# Patient Record
Sex: Female | Born: 1950 | Race: White | Hispanic: No | Marital: Married | Smoking: Never smoker
Health system: Southern US, Community
[De-identification: ages and names within clinical notes are randomized; demographics above are authoritative.]

## PROBLEM LIST (undated history)

## (undated) HISTORY — PX: TONSILLECTOMY: SUR1361

## (undated) HISTORY — PX: GANGLION CYST EXCISION: SHX1691

---

## 1999-10-27 ENCOUNTER — Encounter: Payer: Self-pay | Admitting: Obstetrics and Gynecology

## 1999-10-27 ENCOUNTER — Encounter: Admission: RE | Admit: 1999-10-27 | Discharge: 1999-10-27 | Payer: Self-pay | Admitting: Obstetrics and Gynecology

## 2000-11-11 ENCOUNTER — Encounter: Payer: Self-pay | Admitting: Obstetrics and Gynecology

## 2000-11-11 ENCOUNTER — Encounter: Admission: RE | Admit: 2000-11-11 | Discharge: 2000-11-11 | Payer: Self-pay | Admitting: Obstetrics and Gynecology

## 2000-12-15 ENCOUNTER — Other Ambulatory Visit: Admission: RE | Admit: 2000-12-15 | Discharge: 2000-12-15 | Payer: Self-pay | Admitting: Obstetrics and Gynecology

## 2000-12-20 ENCOUNTER — Encounter: Payer: Self-pay | Admitting: Obstetrics and Gynecology

## 2000-12-20 ENCOUNTER — Ambulatory Visit (HOSPITAL_COMMUNITY): Admission: RE | Admit: 2000-12-20 | Discharge: 2000-12-20 | Payer: Self-pay | Admitting: Obstetrics and Gynecology

## 2001-01-31 ENCOUNTER — Encounter: Payer: Self-pay | Admitting: Obstetrics and Gynecology

## 2001-01-31 ENCOUNTER — Ambulatory Visit (HOSPITAL_COMMUNITY): Admission: RE | Admit: 2001-01-31 | Discharge: 2001-01-31 | Payer: Self-pay | Admitting: Obstetrics and Gynecology

## 2001-11-14 ENCOUNTER — Encounter: Payer: Self-pay | Admitting: Obstetrics and Gynecology

## 2001-11-14 ENCOUNTER — Encounter: Admission: RE | Admit: 2001-11-14 | Discharge: 2001-11-14 | Payer: Self-pay | Admitting: Obstetrics and Gynecology

## 2002-01-10 ENCOUNTER — Other Ambulatory Visit: Admission: RE | Admit: 2002-01-10 | Discharge: 2002-01-10 | Payer: Self-pay | Admitting: Obstetrics and Gynecology

## 2002-11-16 ENCOUNTER — Encounter: Admission: RE | Admit: 2002-11-16 | Discharge: 2002-11-16 | Payer: Self-pay | Admitting: Obstetrics and Gynecology

## 2003-01-16 ENCOUNTER — Other Ambulatory Visit: Admission: RE | Admit: 2003-01-16 | Discharge: 2003-01-16 | Payer: Self-pay | Admitting: Obstetrics and Gynecology

## 2003-04-04 ENCOUNTER — Encounter: Admission: RE | Admit: 2003-04-04 | Discharge: 2003-04-04 | Payer: Self-pay | Admitting: Obstetrics and Gynecology

## 2003-11-22 ENCOUNTER — Encounter: Admission: RE | Admit: 2003-11-22 | Discharge: 2003-11-22 | Payer: Self-pay | Admitting: Obstetrics and Gynecology

## 2004-02-01 ENCOUNTER — Other Ambulatory Visit: Admission: RE | Admit: 2004-02-01 | Discharge: 2004-02-01 | Payer: Self-pay | Admitting: Obstetrics and Gynecology

## 2004-12-03 ENCOUNTER — Encounter: Admission: RE | Admit: 2004-12-03 | Discharge: 2004-12-03 | Payer: Self-pay | Admitting: Obstetrics and Gynecology

## 2005-02-10 ENCOUNTER — Other Ambulatory Visit: Admission: RE | Admit: 2005-02-10 | Discharge: 2005-02-10 | Payer: Self-pay | Admitting: Obstetrics and Gynecology

## 2005-12-04 ENCOUNTER — Encounter: Admission: RE | Admit: 2005-12-04 | Discharge: 2005-12-04 | Payer: Self-pay | Admitting: Obstetrics and Gynecology

## 2009-01-14 ENCOUNTER — Encounter: Admission: RE | Admit: 2009-01-14 | Discharge: 2009-01-14 | Payer: Self-pay | Admitting: Obstetrics and Gynecology

## 2009-05-13 ENCOUNTER — Encounter: Admission: RE | Admit: 2009-05-13 | Discharge: 2009-05-13 | Payer: Self-pay | Admitting: Obstetrics and Gynecology

## 2010-01-23 ENCOUNTER — Encounter
Admission: RE | Admit: 2010-01-23 | Discharge: 2010-01-23 | Payer: Self-pay | Source: Home / Self Care | Attending: Obstetrics and Gynecology | Admitting: Obstetrics and Gynecology

## 2010-02-09 ENCOUNTER — Encounter: Payer: Self-pay | Admitting: Obstetrics and Gynecology

## 2010-12-19 ENCOUNTER — Other Ambulatory Visit: Payer: Self-pay | Admitting: Obstetrics and Gynecology

## 2010-12-19 DIAGNOSIS — Z1231 Encounter for screening mammogram for malignant neoplasm of breast: Secondary | ICD-10-CM

## 2011-01-26 ENCOUNTER — Ambulatory Visit
Admission: RE | Admit: 2011-01-26 | Discharge: 2011-01-26 | Disposition: A | Discharge status: Home / Self Care | Payer: BC Managed Care – PPO | Source: Ambulatory Visit | Attending: Obstetrics and Gynecology | Admitting: Obstetrics and Gynecology

## 2011-01-26 DIAGNOSIS — Z1231 Encounter for screening mammogram for malignant neoplasm of breast: Secondary | ICD-10-CM

## 2011-09-24 ENCOUNTER — Emergency Department (HOSPITAL_BASED_OUTPATIENT_CLINIC_OR_DEPARTMENT_OTHER)
Admission: EM | Admit: 2011-09-24 | Discharge: 2011-09-25 | Disposition: A | Discharge status: Home / Self Care | Payer: BC Managed Care – PPO | Attending: Emergency Medicine | Admitting: Emergency Medicine

## 2011-09-24 ENCOUNTER — Emergency Department (HOSPITAL_BASED_OUTPATIENT_CLINIC_OR_DEPARTMENT_OTHER): Payer: BC Managed Care – PPO

## 2011-09-24 ENCOUNTER — Encounter (HOSPITAL_BASED_OUTPATIENT_CLINIC_OR_DEPARTMENT_OTHER): Payer: Self-pay | Admitting: Emergency Medicine

## 2011-09-24 DIAGNOSIS — Z79899 Other long term (current) drug therapy: Secondary | ICD-10-CM | POA: Insufficient documentation

## 2011-09-24 DIAGNOSIS — R197 Diarrhea, unspecified: Secondary | ICD-10-CM | POA: Insufficient documentation

## 2011-09-24 DIAGNOSIS — R110 Nausea: Secondary | ICD-10-CM | POA: Insufficient documentation

## 2011-09-24 DIAGNOSIS — R42 Dizziness and giddiness: Secondary | ICD-10-CM | POA: Insufficient documentation

## 2011-09-24 DIAGNOSIS — H8110 Benign paroxysmal vertigo, unspecified ear: Secondary | ICD-10-CM

## 2011-09-24 DIAGNOSIS — H538 Other visual disturbances: Secondary | ICD-10-CM | POA: Insufficient documentation

## 2011-09-24 LAB — CBC
HCT: 35.7 % — ABNORMAL LOW (ref 36.0–46.0)
Hemoglobin: 12.1 g/dL (ref 12.0–15.0)
MCH: 29.2 pg (ref 26.0–34.0)
MCHC: 33.9 g/dL (ref 30.0–36.0)
MCV: 86 fL (ref 78.0–100.0)
Platelets: 279 10*3/uL (ref 150–400)
RBC: 4.15 MIL/uL (ref 3.87–5.11)
RDW: 13.2 % (ref 11.5–15.5)
WBC: 5.3 10*3/uL (ref 4.0–10.5)

## 2011-09-24 LAB — COMPREHENSIVE METABOLIC PANEL
ALT: 14 U/L (ref 0–35)
AST: 22 U/L (ref 0–37)
Albumin: 3.8 g/dL (ref 3.5–5.2)
Alkaline Phosphatase: 64 U/L (ref 39–117)
BUN: 17 mg/dL (ref 6–23)
Calcium: 9.5 mg/dL (ref 8.4–10.5)
Chloride: 101 mEq/L (ref 96–112)
Creatinine, Ser: 0.7 mg/dL (ref 0.50–1.10)
GFR calc Af Amer: >90 mL/min (ref >90)
Glucose, Bld: 114 mg/dL — ABNORMAL HIGH (ref 70–99)
Sodium: 138 mEq/L (ref 135–145)
Total Bilirubin: 0.2 mg/dL — ABNORMAL LOW (ref 0.3–1.2)
Total Protein: 6.9 g/dL (ref 6.0–8.3)

## 2011-09-24 LAB — RAPID URINE DRUG SCREEN, HOSP PERFORMED
Barbiturates: NOT DETECTED
Benzodiazepines: NOT DETECTED
Cocaine: NOT DETECTED
Opiates: NOT DETECTED
Tetrahydrocannabinol: NOT DETECTED

## 2011-09-24 LAB — DIFFERENTIAL
Basophils Absolute: 0 10*3/uL (ref 0.0–0.1)
Basophils Relative: 0 % (ref 0–1)
Eosinophils Absolute: 0.1 10*3/uL (ref 0.0–0.7)
Eosinophils Relative: 1 % (ref 0–5)
Lymphs Abs: 1.6 10*3/uL (ref 0.7–4.0)
Monocytes Absolute: 0.5 10*3/uL (ref 0.1–1.0)
Monocytes Relative: 9 % (ref 3–12)
Neutro Abs: 3.2 10*3/uL (ref 1.7–7.7)
Neutrophils Relative %: 60 % (ref 43–77)

## 2011-09-24 LAB — GLUCOSE, CAPILLARY: Glucose-Capillary: 107 mg/dL — ABNORMAL HIGH (ref 70–99)

## 2011-09-24 LAB — TROPONIN I: Troponin I: <0.3 ng/mL (ref <0.30)

## 2011-09-24 LAB — URINALYSIS, ROUTINE W REFLEX MICROSCOPIC
Ketones, ur: NEGATIVE mg/dL
Nitrite: NEGATIVE
Protein, ur: NEGATIVE mg/dL

## 2011-09-24 LAB — CK TOTAL AND CKMB (NOT AT ARMC)
CK, MB: 1.9 ng/mL (ref 0.3–4.0)
Relative Index: INVALID (ref 0.0–2.5)
Total CK: 92 U/L (ref 7–177)

## 2011-09-24 LAB — APTT: aPTT: 29 seconds (ref 24–37)

## 2011-09-24 MED ORDER — MECLIZINE HCL 25 MG PO TABS
25.0000 mg | ORAL_TABLET | Freq: Once | ORAL | Status: AC
  Administered 2011-09-24: 25 mg via ORAL
  Filled 2011-09-24: qty 1

## 2011-09-24 MED ORDER — ONDANSETRON HCL 4 MG/2ML IJ SOLN
4.0000 mg | Freq: Once | INTRAMUSCULAR | Status: AC
  Administered 2011-09-24: 4 mg via INTRAVENOUS
  Filled 2011-09-24: qty 2

## 2011-09-24 MED ORDER — SODIUM CHLORIDE 0.9 % IV BOLUS (SEPSIS)
500.0000 mL | Freq: Once | INTRAVENOUS | Status: AC
  Administered 2011-09-24: 500 mL via INTRAVENOUS

## 2011-09-24 NOTE — ED Notes (Signed)
Onset today of dizziness and nausea

## 2011-09-24 NOTE — ED Provider Notes (Signed)
History     CSN: [id number]  Arrival date & time 09/24/11  2151   First MD Initiated Contact with Patient 09/24/11 2210      Chief Complaint  Patient presents with  . Dizziness    (Consider location/radiation/quality/duration/timing/severity/associated sxs/prior treatment) HPI Patient is a 61 year old female who presents today complaining of acute onset of dizziness which she describes as a spinning sensation at 1 PM today. Patient has had this 2 other times and has never sought evaluation. She is reported that these episodes have lasted several hours and resolved spontaneously in the past. She does feel that they are related to position. She endorses nausea and denies any vomiting. Patient notes that she did have some diarrhea as well. She denies any abdominal pain. Patient denies numbness, tingling, or weakness. She denies any visual deficits. Patient reports that she does have some slightly blurry vision out of her left eye. Patient has no history of TIA or stroke.  Patient denies any changes in her hearing. She reports that she has a brother with a history of spontaneous intracranial bleed as well as family history of stroke. Patient denies any history of diabetes or hypertension. There are no other associated or modifying factors. History reviewed. No pertinent past medical history.  Past Surgical History  Procedure Date  . Ganglion cyst excision     History reviewed. No pertinent family history.  History  Substance Use Topics  . Smoking status: Never Smoker   . Smokeless tobacco: Not on file  . Alcohol Use: No    OB History    Grav Para Term Preterm Abortions TAB SAB Ect Mult Living                  Review of Systems  Constitutional: Negative.   Eyes: Positive for visual disturbance.  Respiratory: Negative.   Cardiovascular: Negative.   Gastrointestinal: Positive for nausea and diarrhea. Negative for vomiting and blood in stool.  Genitourinary: Negative.     Musculoskeletal: Negative.   Skin: Negative.   Neurological: Positive for dizziness and headaches. Negative for weakness and numbness.  Hematological: Negative.   Psychiatric/Behavioral: Negative.   All other systems reviewed and are negative.    Allergies  Sulfa drugs cross reactors  Home Medications   Current Outpatient Rx  Name Route Sig Dispense Refill  . ALENDRONATE SODIUM 70 MG PO TABS Oral Take 70 mg by mouth every 7 (seven) days. Take with a full glass of water on an empty stomach.    Marland Kitchen CALCIUM CARBONATE 600 MG PO TABS Oral Take 600 mg by mouth 2 (two) times daily with a meal.    . VITAMIN D 1000 UNITS PO TABS Oral Take 1,000 Units by mouth daily.    . IBUPROFEN 200 MG PO TABS Oral Take 200 mg by mouth every 6 (six) hours as needed. For headache.    Marland Kitchen LYSINE PO Oral Take 1 tablet by mouth daily as needed. For cold sore.    Marland Kitchen ONE-DAILY MULTI VITAMINS PO TABS Oral Take 1 tablet by mouth daily.    . RED YEAST RICE PO Oral Take 2 capsules by mouth daily.      BP 147/81  Pulse 102  Resp 16  Ht 5\' 2"  (1.575 m)  Wt 130 lb (58.968 kg)  BMI 23.78 kg/m2  Physical Exam  Nursing note and vitals reviewed. GEN: Well-developed, well-nourished female in no distress HEENT: Atraumatic, normocephalic. Oropharynx clear without erythema EYES: PERRLA BL, no scleral icterus. NECK: Trachea  midline, no meningismus CV: regular rate and rhythm. No murmurs, rubs, or gallops PULM: No respiratory distress.  No crackles, wheezes, or rales. GI: soft, non-tender. No guarding, rebound, or tenderness. + bowel sounds  GU: deferred Neuro: cranial nerves grossly 2-12 intact, no abnormalities of strength or sensation, A and O x 3, no dysmetria on finger to nose task bilaterally, no limb drift, no facial droop, no visual field deficits, no pronator drift, no dysarthria, patient does have exacerbation of her symptoms on Dix-Hallpike maneuver MSK: Patient moves all 4 extremities symmetrically, no  deformity, edema, or injury noted Skin: No rashes petechiae, purpura, or jaundice Psych: no abnormality of mood   ED Course  Procedures (including critical care time)  Indication: Dizziness Please note this EKG was reviewed extemporaneously by myself.   Date: 09/24/2011  Rate: 70  Rhythm: normal sinus rhythm  QRS Axis: normal  Intervals: normal  ST/T Wave abnormalities: nonspecific T wave changes  Conduction Disutrbances:none  Narrative Interpretation:   Old EKG Reviewed: none available      Labs Reviewed  CBC - Abnormal; Notable for the following:    HCT 35.7 (*)     All other components within normal limits  COMPREHENSIVE METABOLIC PANEL - Abnormal; Notable for the following:    Glucose, Bld 114 (*)     Total Bilirubin 0.2 (*)     All other components within normal limits  URINALYSIS, ROUTINE W REFLEX MICROSCOPIC - Abnormal; Notable for the following:    Hgb urine dipstick SMALL (*)     Leukocytes, UA SMALL (*)     All other components within normal limits  GLUCOSE, CAPILLARY - Abnormal; Notable for the following:    Glucose-Capillary 107 (*)     All other components within normal limits  URINE MICROSCOPIC-ADD ON - Abnormal; Notable for the following:    Bacteria, UA FEW (*)     All other components within normal limits  PROTIME-INR  APTT  DIFFERENTIAL  CK TOTAL AND CKMB  TROPONIN I  URINE RAPID DRUG SCREEN (HOSP PERFORMED)   Ct Head Wo Contrast  09/24/2011  *RADIOLOGY REPORT*  Clinical Data: Dizziness for 10 hours.  No known injury.  CT HEAD WITHOUT CONTRAST  Technique:  Contiguous axial images were obtained from the base of the skull through the vertex without contrast.  Comparison: None.  Findings: There is no evidence of acute intracranial hemorrhage, mass lesion, brain edema or extra-axial fluid collection.  The ventricles and subarachnoid spaces are appropriately sized for age. There is no CT evidence of acute cortical infarction.  There is a polypoid filling  defect in the left division of the sphenoid sinus which measures 1.6 cm in diameter and 54 HU.  This could reflect a polyp or atypical mucous retention cyst.  The visualized paranasal sinuses are otherwise clear. The calvarium is intact. The mastoids and middle ears are clear.  The internal auditory canals appear symmetric.  IMPRESSION:  1.  No acute intracranial or calvarial findings. 2.  Left sphenoid sinus polyp or mucous retention cyst.   Original Report Authenticated By: Gerrianne Scale, M.D.      1. BPPV (benign paroxysmal positional vertigo)       MDM  Patient was evaluated by myself. Based on presentation patient was evaluated. She was outside the three-hour window to merit code stroke evaluation. Additionally patient had isolated complained of slight blurry vision in the left eye as well as dizziness. Stroke workup was performed and was unremarkable. Patient was feeling  better following return of results. She was given a 500 cc normal saline IV bolus as well as a dose of meclizine. Patient did receive one dose of Zofran initially. As she was feeling better patient initially had symptoms that were able to be recreated with Dix-Hallpike maneuver. I feel that her symptoms are most likely secondary to be BPPV today. She had no signs of infection such as UTI or other laboratory abnormalities. Patient was discharged with prescription for Zofran and meclizine. She was told that there are strokes that can present with similar symptoms and that she should not assume that all episodes of dizziness are from this benign cause. She is advised to followup with her primary care provider and was discharged in good condition.        Cyndra Numbers, MD 09/25/11 [phone]

## 2011-09-25 MED ORDER — MECLIZINE HCL 25 MG PO TABS: 25.0000 mg | ORAL_TABLET | Freq: Three times a day (TID) | ORAL | Status: AC | PRN

## 2011-09-25 MED ORDER — ONDANSETRON 8 MG PO TBDP: 8.0000 mg | ORAL_TABLET | Freq: Three times a day (TID) | ORAL | Status: AC | PRN

## 2011-12-22 ENCOUNTER — Other Ambulatory Visit: Payer: Self-pay | Admitting: Obstetrics and Gynecology

## 2011-12-22 DIAGNOSIS — Z1231 Encounter for screening mammogram for malignant neoplasm of breast: Secondary | ICD-10-CM

## 2012-02-05 ENCOUNTER — Ambulatory Visit
Admission: RE | Admit: 2012-02-05 | Discharge: 2012-02-05 | Disposition: A | Discharge status: Home / Self Care | Payer: BC Managed Care – PPO | Source: Ambulatory Visit | Attending: Obstetrics and Gynecology | Admitting: Obstetrics and Gynecology

## 2012-02-05 DIAGNOSIS — Z1231 Encounter for screening mammogram for malignant neoplasm of breast: Secondary | ICD-10-CM

## 2013-01-02 ENCOUNTER — Other Ambulatory Visit: Payer: Self-pay

## 2013-01-02 DIAGNOSIS — Z1231 Encounter for screening mammogram for malignant neoplasm of breast: Secondary | ICD-10-CM

## 2013-02-06 ENCOUNTER — Ambulatory Visit
Admission: RE | Admit: 2013-02-06 | Discharge: 2013-02-06 | Disposition: A | Discharge status: Home / Self Care | Payer: Self-pay | Source: Ambulatory Visit

## 2013-02-06 DIAGNOSIS — Z1231 Encounter for screening mammogram for malignant neoplasm of breast: Secondary | ICD-10-CM

## 2014-10-16 IMAGING — MG MM SCREENING BREAST TOMO BILATERAL
8 series · 9 of 24 positions shown · non-contrast
Comparison: Previous exam(s).

CLINICAL DATA: Screening.

EXAM:
DIGITAL SCREENING BILATERAL MAMMOGRAM WITH 3D TOMO WITH CAD

[L MLO]
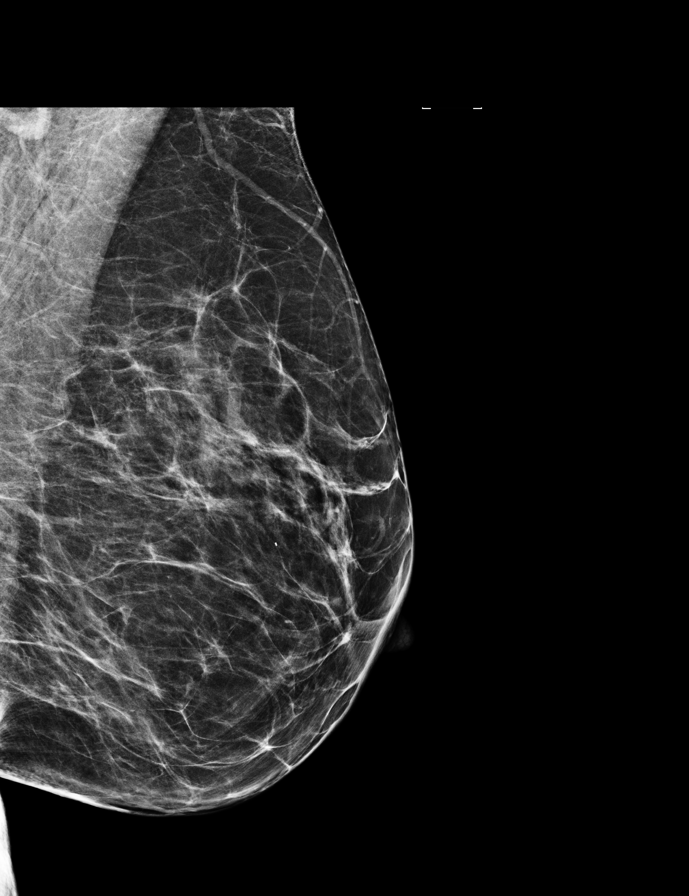

[L CC]
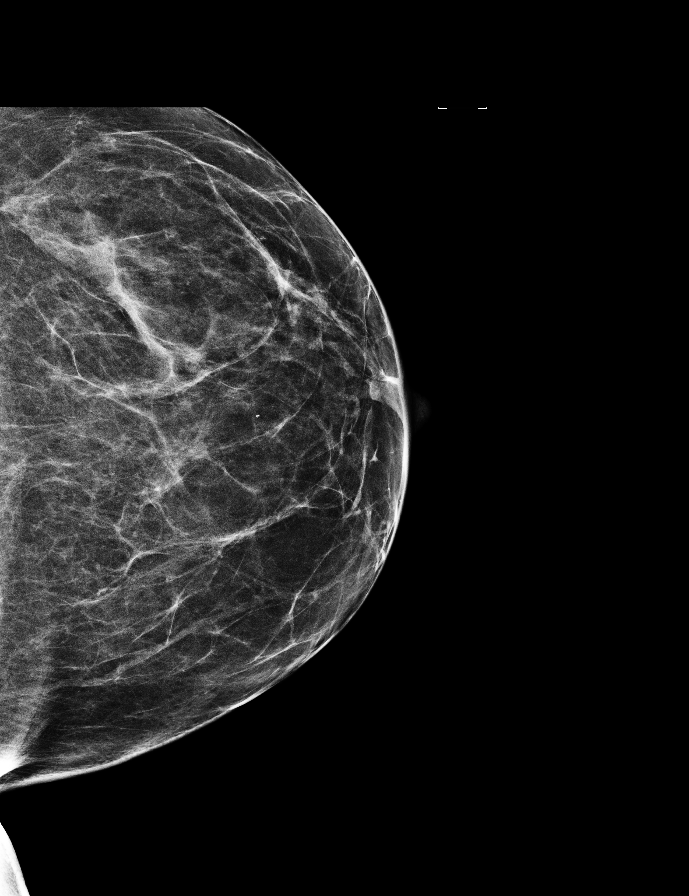

[R MLO]
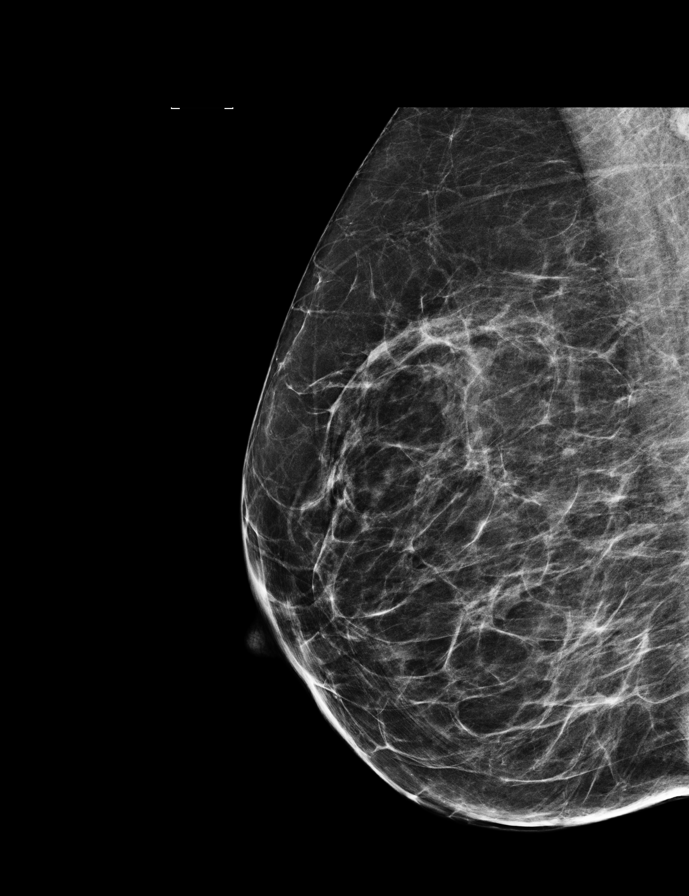

[R CC]
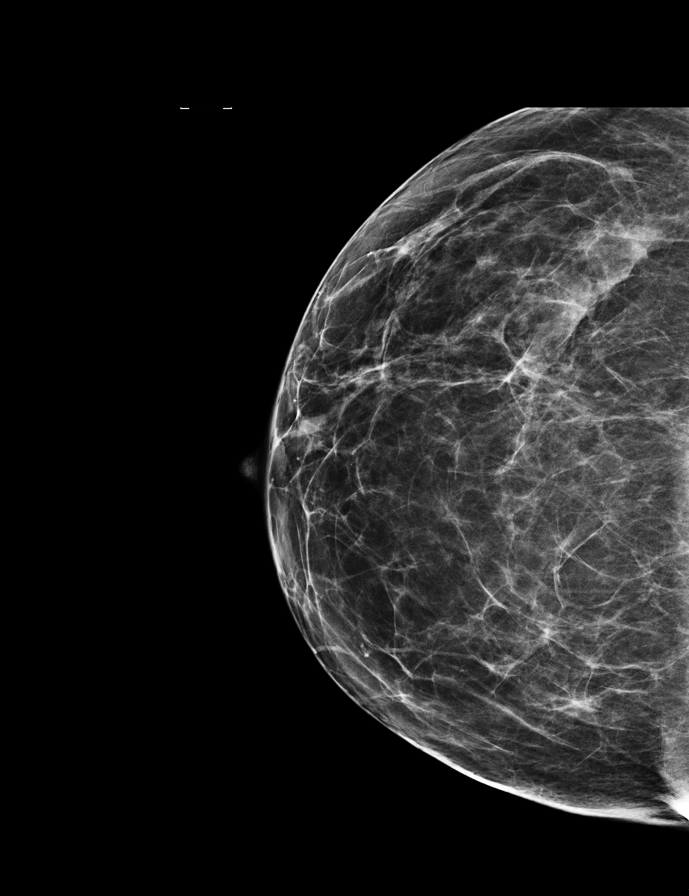

[L MLO tomo · 2 of 59 frames shown]
[frame 20/59]
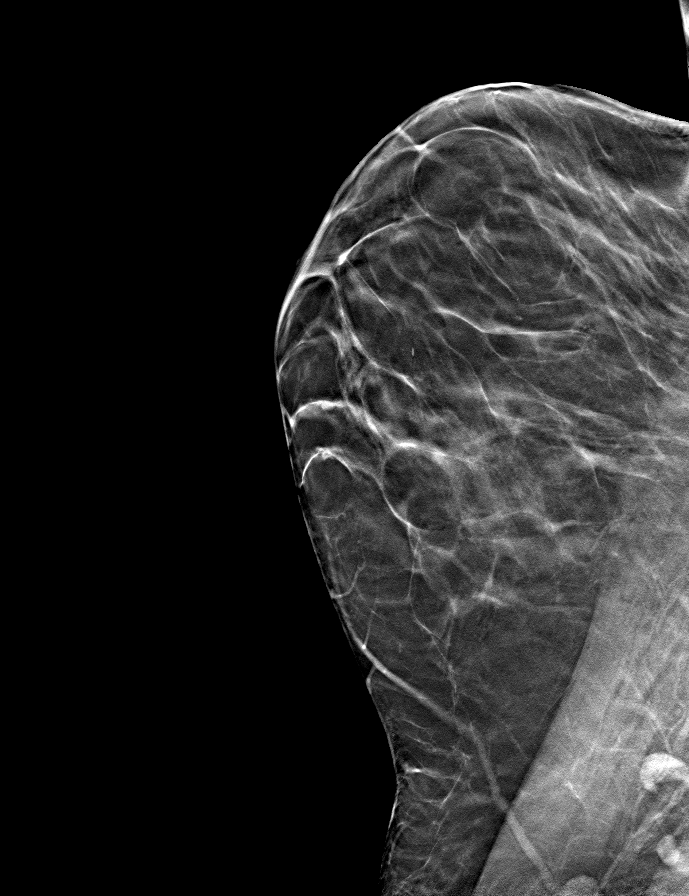
[frame 30/59]
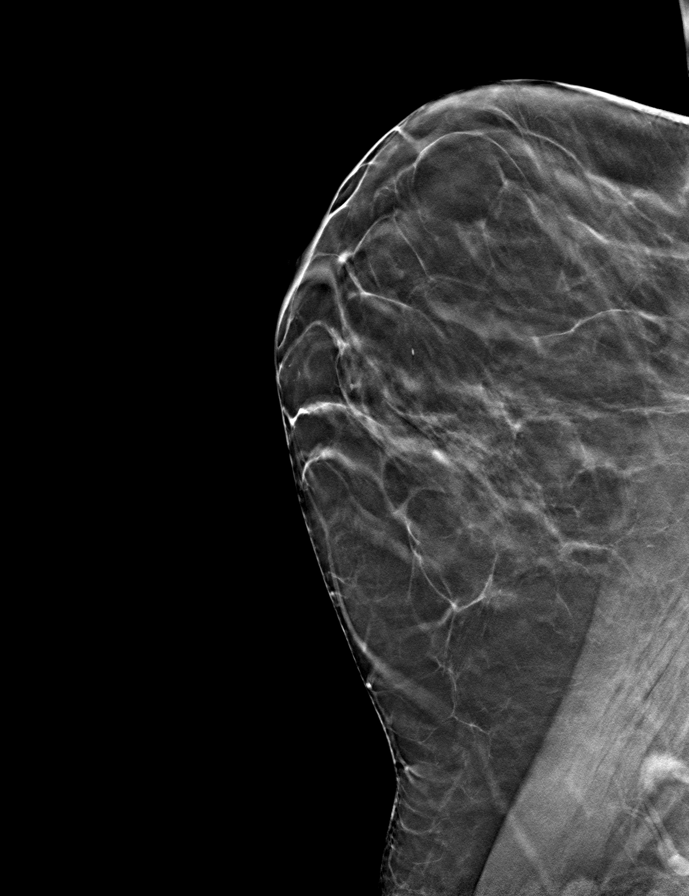

[L CC tomo · tomo slice 27/54.0]
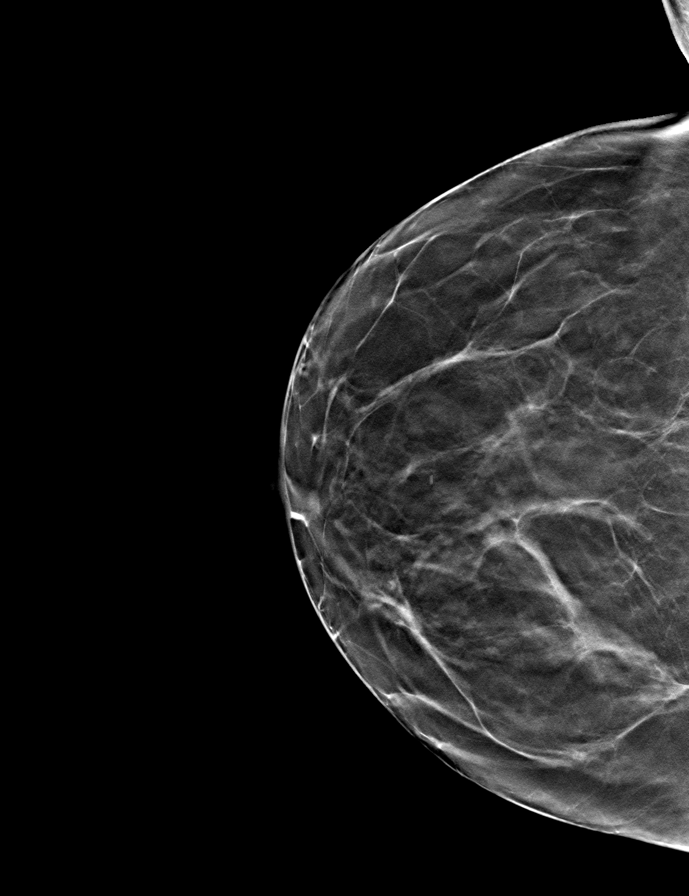

[R CC tomo · tomo slice 29/58.0]
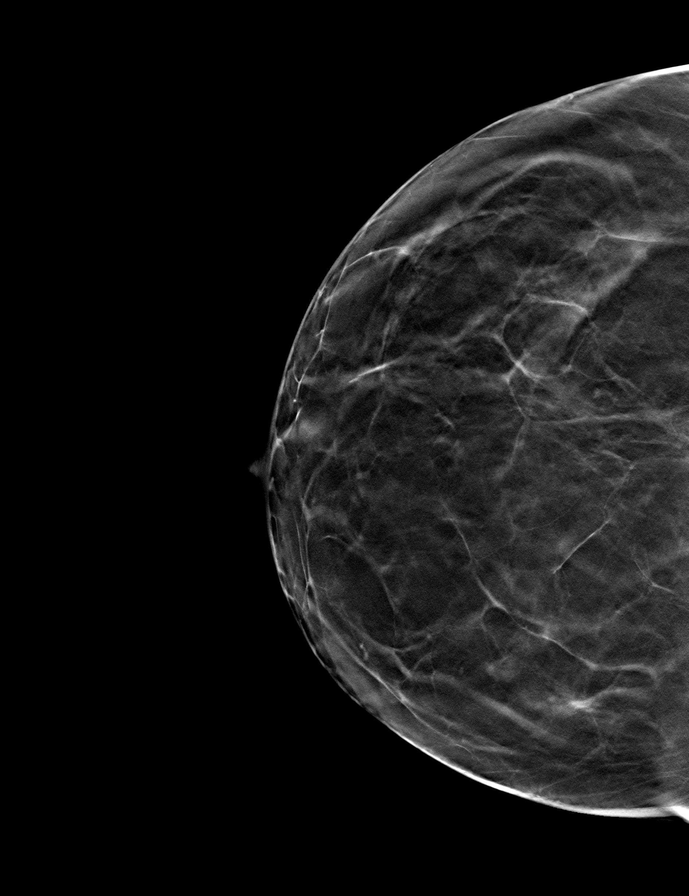

[R MLO tomo · tomo slice 29/58.0]
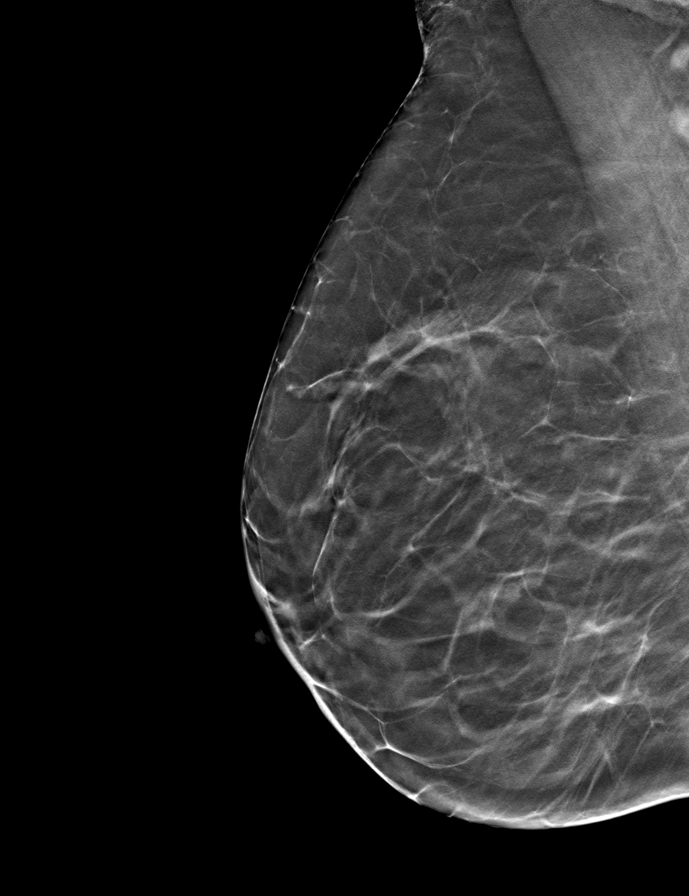

[9 of 24 positions shown; findings below may reference images not displayed]

ACR Breast Density Category c: The breast tissue is heterogeneously
dense, which may obscure small masses.
FINDINGS: There are no findings suspicious for malignancy. Images were
processed with CAD.
IMPRESSION: No mammographic evidence of malignancy. A result letter of this
screening mammogram will be mailed directly to the patient.

RECOMMENDATION:
Screening mammogram in one year. (Code:OA-G-1SS)

BI-RADS CATEGORY  1: Negative.

## 2015-11-06 ENCOUNTER — Emergency Department (HOSPITAL_BASED_OUTPATIENT_CLINIC_OR_DEPARTMENT_OTHER)
Admission: EM | Admit: 2015-11-06 | Discharge: 2015-11-07 | Disposition: A | Discharge status: Home / Self Care | Payer: Medicare Other | Attending: Emergency Medicine | Admitting: Emergency Medicine

## 2015-11-06 ENCOUNTER — Encounter (HOSPITAL_BASED_OUTPATIENT_CLINIC_OR_DEPARTMENT_OTHER): Payer: Self-pay

## 2015-11-06 DIAGNOSIS — K219 Gastro-esophageal reflux disease without esophagitis: Secondary | ICD-10-CM | POA: Diagnosis not present

## 2015-11-06 DIAGNOSIS — R072 Precordial pain: Secondary | ICD-10-CM | POA: Diagnosis present

## 2015-11-06 NOTE — ED Triage Notes (Signed)
Pt c/o a cold feeling sensation that came over her about ago, states "I don't feel right"; pt c/o intigestion to center of chest since yesterday but now feels like a lump

## 2015-11-07 DIAGNOSIS — K219 Gastro-esophageal reflux disease without esophagitis: Secondary | ICD-10-CM | POA: Diagnosis not present

## 2015-11-07 LAB — URINALYSIS, ROUTINE W REFLEX MICROSCOPIC
Bilirubin Urine: NEGATIVE
Glucose, UA: NEGATIVE mg/dL
Hgb urine dipstick: NEGATIVE
Ketones, ur: NEGATIVE mg/dL
NITRITE: NEGATIVE
PROTEIN: NEGATIVE mg/dL
SPECIFIC GRAVITY, URINE: 1.005 (ref 1.005–1.030)
pH: 6.5 (ref 5.0–8.0)

## 2015-11-07 LAB — CBC WITH DIFFERENTIAL/PLATELET
BASOS ABS: 0 10*3/uL (ref 0.0–0.1)
BASOS PCT: 0 %
EOS ABS: 0.1 10*3/uL (ref 0.0–0.7)
EOS PCT: 2 %
HCT: 34.1 % — ABNORMAL LOW (ref 36.0–46.0)
HEMOGLOBIN: 11.5 g/dL — AB (ref 12.0–15.0)
LYMPHS ABS: 1.2 10*3/uL (ref 0.7–4.0)
Lymphocytes Relative: 24 %
MCH: 29.6 pg (ref 26.0–34.0)
MCHC: 33.7 g/dL (ref 30.0–36.0)
MCV: 87.9 fL (ref 78.0–100.0)
Monocytes Absolute: 0.5 10*3/uL (ref 0.1–1.0)
Monocytes Relative: 10 %
NEUTROS PCT: 64 %
Neutro Abs: 3.4 10*3/uL (ref 1.7–7.7)
PLATELETS: 283 10*3/uL (ref 150–400)
RBC: 3.88 MIL/uL (ref 3.87–5.11)
RDW: 13.2 % (ref 11.5–15.5)
WBC: 5.2 10*3/uL (ref 4.0–10.5)

## 2015-11-07 LAB — BASIC METABOLIC PANEL
Anion gap: 9 (ref 5–15)
BUN: 13 mg/dL (ref 6–20)
CALCIUM: 8.9 mg/dL (ref 8.9–10.3)
CHLORIDE: 105 mmol/L (ref 101–111)
CO2: 25 mmol/L (ref 22–32)
CREATININE: 0.69 mg/dL (ref 0.44–1.00)
Glucose, Bld: 101 mg/dL — ABNORMAL HIGH (ref 65–99)
Potassium: 3.4 mmol/L — ABNORMAL LOW (ref 3.5–5.1)
SODIUM: 139 mmol/L (ref 135–145)

## 2015-11-07 LAB — TROPONIN I

## 2015-11-07 LAB — URINE MICROSCOPIC-ADD ON: RBC / HPF: NONE SEEN RBC/hpf (ref 0–5)

## 2015-11-07 MED ORDER — PANTOPRAZOLE SODIUM 40 MG PO TBEC
40.0000 mg | DELAYED_RELEASE_TABLET | Freq: Once | ORAL | Status: AC
  Administered 2015-11-07: 40 mg via ORAL
  Filled 2015-11-07: qty 1

## 2015-11-07 MED ORDER — OMEPRAZOLE 20 MG PO CPDR: 20.0000 mg | DELAYED_RELEASE_CAPSULE | Freq: Every day | ORAL | 0 refills | Status: AC

## 2015-11-07 MED ORDER — GI COCKTAIL ~~LOC~~
30.0000 mL | Freq: Once | ORAL | Status: AC
  Administered 2015-11-07: 30 mL via ORAL
  Filled 2015-11-07: qty 30

## 2015-11-07 NOTE — ED Provider Notes (Signed)
[facility] Provider Note: Lowella DellJ. Lane Laurieann Friddle, MD, FACEP  CSN: [id number] MRN: [id number] ARRIVAL: 11/06/15 at 2340   CHIEF COMPLAINT  Doesn't Feel Right   HISTORY OF PRESENT ILLNESS  Charlene Walsh is a 65 y.o. female without significant past medical history. She is here with a vaguely described discomfort in her substernal region. It began about 2 hours ago. She does not describe it as pain and rates it as a 2 out of 10 at worst. It is not like her usual heartburn. Nothing makes this discomfort better or worse. She has not taken anything for it. It does not radiate. There is no associated nausea, vomiting, diarrhea, shortness of breath or diaphoresis. She did have chills earlier.   History reviewed. No pertinent past medical history.  Past Surgical History:  Procedure Laterality Date  . GANGLION CYST EXCISION    . TONSILLECTOMY      No family history on file.  Social History  Substance Use Topics  . Smoking status: Never Smoker  . Smokeless tobacco: Never Used  . Alcohol use No    Prior to Admission medications   Medication Sig Start Date End Date Taking? Authorizing Provider  alendronate (FOSAMAX) 70 MG tablet Take 70 mg by mouth every 7 (seven) days. Take with a full glass of water on an empty stomach.    Historical Provider, MD  calcium carbonate (OS-CAL) 600 MG TABS Take 600 mg by mouth 2 (two) times daily with a meal.    Historical Provider, MD  cholecalciferol (VITAMIN D) 1000 UNITS tablet Take 1,000 Units by mouth daily.    Historical Provider, MD  ibuprofen (ADVIL,MOTRIN) 200 MG tablet Take 200 mg by mouth every 6 (six) hours as needed. For headache.    Historical Provider, MD  LYSINE PO Take 1 tablet by mouth daily as needed. For cold sore.    Historical Provider, MD  Multiple Vitamin (MULTIVITAMIN) tablet Take 1 tablet by mouth daily.    Historical Provider, MD  omeprazole (PRILOSEC) 20 MG capsule Take 1 capsule (20 mg total) by mouth daily. 11/07/15    Primus Gritton, MD  Red Yeast Rice Extract (RED YEAST RICE PO) Take 2 capsules by mouth daily.    Historical Provider, MD    Allergies Sulfa drugs cross reactors   REVIEW OF SYSTEMS  Negative except as noted here or in the History of Present Illness.   PHYSICAL EXAMINATION  Initial Vital Signs Blood pressure 152/74, pulse 92, temperature 97.6 F (36.4 C), temperature source Oral, resp. rate 18, height 5\' 2"  (1.575 m), weight 125 lb (56.7 kg), SpO2 96 %.  Examination General: Well-developed, well-nourished female in no acute distress; appearance consistent with age of record HENT: normocephalic; atraumatic Eyes: pupils equal, round and reactive to light; extraocular muscles intact Neck: supple Heart: regular rate and rhythm; no murmurs, rubs or gallops Lungs: clear to auscultation bilaterally Abdomen: soft; nondistended; nontender; no masses or hepatosplenomegaly; bowel sounds present Extremities: No deformity; full range of motion; pulses normal Neurologic: Awake, alert and oriented; motor function intact in all extremities and symmetric; no facial droop Skin: Warm and dry Psychiatric: Normal mood and affect   RESULTS  Summary of this visit's results, reviewed by myself:   EKG Interpretation  Date/Time:  Thursday November 07 2015 00:00:49 EDT Ventricular Rate:  78 PR Interval:    QRS Duration: 92 QT Interval:  385 QTC Calculation: 439 R Axis:   67 Text Interpretation:  Sinus rhythm Normal ECG No significant change  was found Confirmed by [facility]  MD, Jonny Ruiz ([id number]) on 11/07/2015 12:06:21 AM      Laboratory Studies: Results for orders placed or performed during the hospital encounter of 11/06/15 (from the past 24 hour(s))  Basic metabolic panel     Status: Abnormal   Collection Time: 11/07/15 12:40 AM  Result Value Ref Range   Sodium 139 135 - 145 mmol/L   Potassium 3.4 (L) 3.5 - 5.1 mmol/L   Chloride 105 101 - 111 mmol/L   CO2 25 22 - 32 mmol/L   Glucose, Bld 101 (H)  65 - 99 mg/dL   BUN 13 6 - 20 mg/dL   Creatinine, Ser 6.04 0.44 - 1.00 mg/dL   Calcium 8.9 8.9 - 54.0 mg/dL   GFR calc non Af Amer >60 >60 mL/min   GFR calc Af Amer >60 >60 mL/min   Anion gap 9 5 - 15  CBC with Differential/Platelet     Status: Abnormal   Collection Time: 11/07/15 12:40 AM  Result Value Ref Range   WBC 5.2 4.0 - 10.5 K/uL   RBC 3.88 3.87 - 5.11 MIL/uL   Hemoglobin 11.5 (L) 12.0 - 15.0 g/dL   HCT 98.1 (L) 19.1 - 47.8 %   MCV 87.9 78.0 - 100.0 fL   MCH 29.6 26.0 - 34.0 pg   MCHC 33.7 30.0 - 36.0 g/dL   RDW 29.5 62.1 - 30.8 %   Platelets 283 150 - 400 K/uL   Neutrophils Relative % 64 %   Neutro Abs 3.4 1.7 - 7.7 K/uL   Lymphocytes Relative 24 %   Lymphs Abs 1.2 0.7 - 4.0 K/uL   Monocytes Relative 10 %   Monocytes Absolute 0.5 0.1 - 1.0 K/uL   Eosinophils Relative 2 %   Eosinophils Absolute 0.1 0.0 - 0.7 K/uL   Basophils Relative 0 %   Basophils Absolute 0.0 0.0 - 0.1 K/uL  Urinalysis, Routine w reflex microscopic (not at [facility])     Status: Abnormal   Collection Time: 11/07/15 12:45 AM  Result Value Ref Range   Color, Urine YELLOW YELLOW   APPearance CLEAR CLEAR   Specific Gravity, Urine 1.005 1.005 - 1.030   pH 6.5 5.0 - 8.0   Glucose, UA NEGATIVE NEGATIVE mg/dL   Hgb urine dipstick NEGATIVE NEGATIVE   Bilirubin Urine NEGATIVE NEGATIVE   Ketones, ur NEGATIVE NEGATIVE mg/dL   Protein, ur NEGATIVE NEGATIVE mg/dL   Nitrite NEGATIVE NEGATIVE   Leukocytes, UA TRACE (A) NEGATIVE  Troponin I     Status: None   Collection Time: 11/07/15 12:45 AM  Result Value Ref Range   Troponin I <0.03 <0.03 ng/mL  Urine microscopic-add on     Status: Abnormal   Collection Time: 11/07/15 12:45 AM  Result Value Ref Range   Squamous Epithelial / LPF 0-5 (A) NONE SEEN   WBC, UA 0-5 0 - 5 WBC/hpf   RBC / HPF NONE SEEN 0 - 5 RBC/hpf   Bacteria, UA RARE (A) NONE SEEN   Imaging Studies: No results found.  ED COURSE  Nursing notes and initial vitals signs, including pulse  oximetry, reviewed.  Vitals:   11/06/15 2344 11/06/15 2345 11/07/15 0143  BP: 152/74  115/55  Pulse: 92  77  Resp: 18  16  Temp: 97.6 F (36.4 C)    TempSrc: Oral    SpO2: 96%  98%  Weight:  125 lb (56.7 kg)   Height:  5\' 2"  (1.575 m)    1:42 AM  Significant relief with GI cocktail. Patient is asymptomatic.  PROCEDURES    ED DIAGNOSES     ICD-9-CM ICD-10-CM   1. Gastroesophageal reflux disease, esophagitis presence not specified 530.81 K21.9        Paula Libra, MD 11/07/15 0144
